# Patient Record
Sex: Female | Born: 1965 | Race: Black or African American | Hispanic: No | Marital: Single | Smoking: Current every day smoker
Health system: Southern US, Community
[De-identification: ages and names within clinical notes are randomized; demographics above are authoritative.]

## PROBLEM LIST (undated history)

## (undated) DIAGNOSIS — Q752 Hypertelorism: Secondary | ICD-10-CM

## (undated) DIAGNOSIS — E119 Type 2 diabetes mellitus without complications: Secondary | ICD-10-CM

## (undated) DIAGNOSIS — I10 Essential (primary) hypertension: Secondary | ICD-10-CM

## (undated) HISTORY — PX: INNER EAR SURGERY: SHX679

## (undated) HISTORY — DX: Type 2 diabetes mellitus without complications: E11.9

## (undated) HISTORY — DX: Hypertelorism: Q75.2

## (undated) HISTORY — PX: KNEE SURGERY: SHX244

## (undated) HISTORY — DX: Essential (primary) hypertension: I10

---

## 2011-10-13 ENCOUNTER — Emergency Department: Payer: Self-pay | Admitting: Emergency Medicine

## 2014-12-13 ENCOUNTER — Other Ambulatory Visit: Payer: Self-pay | Admitting: Physician Assistant

## 2014-12-13 DIAGNOSIS — Z1231 Encounter for screening mammogram for malignant neoplasm of breast: Secondary | ICD-10-CM

## 2014-12-25 ENCOUNTER — Ambulatory Visit: Payer: Self-pay

## 2014-12-27 ENCOUNTER — Ambulatory Visit
Admission: RE | Admit: 2014-12-27 | Discharge: 2014-12-27 | Disposition: A | Discharge status: Home / Self Care | Payer: Self-pay | Source: Ambulatory Visit | Attending: Physician Assistant | Admitting: Physician Assistant

## 2014-12-27 DIAGNOSIS — Z1231 Encounter for screening mammogram for malignant neoplasm of breast: Secondary | ICD-10-CM | POA: Insufficient documentation

## 2016-01-20 ENCOUNTER — Other Ambulatory Visit: Payer: Self-pay | Admitting: Physician Assistant

## 2017-11-04 ENCOUNTER — Ambulatory Visit: Payer: Self-pay

## 2017-11-15 ENCOUNTER — Ambulatory Visit: Payer: Self-pay | Attending: Oncology

## 2017-11-15 ENCOUNTER — Ambulatory Visit
Admission: RE | Admit: 2017-11-15 | Discharge: 2017-11-15 | Disposition: A | Discharge status: Home / Self Care | Payer: Self-pay | Source: Ambulatory Visit | Attending: Oncology | Admitting: Oncology

## 2017-11-15 VITALS — Ht 72.0 in | Wt 366.0 lb

## 2017-11-15 DIAGNOSIS — Z0000 Encounter for general adult medical examination without abnormal findings: Secondary | ICD-10-CM | POA: Insufficient documentation

## 2017-11-15 NOTE — Progress Notes (Signed)
  Subjective:     Patient ID: Sue Bradley, female   DOB: [birth year], 52 y.o.   MRN: [id number]  HPI   Review of Systems     Objective:   Physical Exam  Pulmonary/Chest: Right breast exhibits no inverted nipple, no mass, no nipple discharge, no skin change and no tenderness. Left breast exhibits no inverted nipple, no mass, no nipple discharge, no skin change and no tenderness. Breasts are asymmetrical.  Right breast larger than left;  Large pendulous breasts       Assessment:     52 year old patient presents for [facility] clinic visit.  Patient screened, and meets BCCCP eligibility.  Patient does not have insurance, Medicare or Medicaid.  Handout given on Affordable Care Act.   Instructed patient on breast self awareness using teach back method.  Clinical breast exam unremarkable.  No mass or lump palpated.   Patient is scheduled for pap with Dr. Nelson Chimes in July 2019.    Plan:     Sent for bilateral screening mammogram.

## 2017-11-15 NOTE — Progress Notes (Signed)
Sent for bilateral screening mammogram.  Letter mailed from [facility] to notify of normal mammogram results.  Patient to return in one year for annual screening.  Copy to [facility]. 

## 2017-11-16 ENCOUNTER — Ambulatory Visit: Payer: Self-pay | Admitting: Adult Health Nurse Practitioner

## 2017-11-16 DIAGNOSIS — E119 Type 2 diabetes mellitus without complications: Secondary | ICD-10-CM | POA: Insufficient documentation

## 2017-11-16 DIAGNOSIS — I10 Essential (primary) hypertension: Secondary | ICD-10-CM

## 2017-11-16 DIAGNOSIS — Z0000 Encounter for general adult medical examination without abnormal findings: Secondary | ICD-10-CM

## 2017-11-16 MED ORDER — LISINOPRIL 40 MG PO TABS: 40.0000 mg | ORAL_TABLET | Freq: Every day | ORAL | 3 refills | Status: DC

## 2017-11-16 NOTE — Progress Notes (Signed)
   Subjective:    Patient ID: Sue Bradley, female    DOB: 11-15-1965, 52 y.o.   MRN: [id number]  HPI  .Sue Bradley is a 52 yo female here to establish care with [place]. She has history of diabetes, and hypertension. She presents with dental pain for 6 mo and a cyst on the left side of the neck.  HTN: she takes 40 mg Lisinopril and 12.5 mg HCTZ. Pt needs refill of Lisinopril. Diabetes: she takes 500 mg Metformin. Neck cyst: pt reports pain around the area occasionally. She reports its been there for years.  Pt endorses heart burn. Pt is a smoker. Pt denies drinking and drug use.  Pt is menopausal with infrequent periods.  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 Health maintenance examination 11/16/2017   Allergies as of 11/16/2017   No Known Allergies     Medication List        Accurate as of 11/16/17  6:12 PM. Always use your most recent med list.          hydrochlorothiazide 12.5 MG capsule Commonly known as:  MICROZIDE Take 12.5 mg by mouth daily.   hydrOXYzine 25 MG tablet Commonly known as:  ATARAX/VISTARIL Take 25 mg by mouth 3 (three) times daily as needed.   lisinopril 40 MG tablet Commonly known as:  PRINIVIL,ZESTRIL Take 40 mg by mouth daily.   metFORMIN 500 MG tablet Commonly known as:  GLUCOPHAGE Take by mouth.        Review of Systems  All other systems reviewed and are negative.      Objective:   Physical Exam  Constitutional: She is oriented to person, place, and time. She appears well-developed and well-nourished.  Neck:    Cardiovascular: Normal rate, regular rhythm, normal heart sounds and intact distal pulses.  Pulmonary/Chest: Effort normal and breath sounds normal.  Abdominal: Soft. Bowel sounds are normal.  Neurological: She is alert and oriented to person, place, and time.  Vitals reviewed.   Pulse 92   Temp 98.1 F (36.7 C)   Wt (!) 373 lb (169.2 kg)    SpO2 99%   BMI 50.59 kg/m        Assessment & Plan:   Labs tonight   HTN: Controlled.  Continue current regimen.  Refilled Lisinopril.  Diabetes:  Check A1c tonight Continue current regimen.  Discussed incorporation of exercise.   F/u in 3 mo for maintenance.   Referral to dental

## 2017-11-16 NOTE — Progress Notes (Signed)
Unable to obtain BP. Need larger cuff size

## 2017-11-17 LAB — COMPREHENSIVE METABOLIC PANEL
A/G RATIO: 1.1 — AB (ref 1.2–2.2)
ALT: 13 IU/L (ref 0–32)
AST: 13 IU/L (ref 0–40)
Albumin: 3.7 g/dL (ref 3.5–5.5)
Alkaline Phosphatase: 81 IU/L (ref 39–117)
BUN/Creatinine Ratio: 12 (ref 9–23)
BUN: 10 mg/dL (ref 6–24)
Bilirubin Total: <0.2 mg/dL (ref 0.0–1.2)
CALCIUM: 9.1 mg/dL (ref 8.7–10.2)
CO2: 24 mmol/L (ref 20–29)
CREATININE: 0.83 mg/dL (ref 0.57–1.00)
Chloride: 105 mmol/L (ref 96–106)
GFR calc Af Amer: 96 mL/min/{1.73_m2} (ref >59)
GFR, EST NON AFRICAN AMERICAN: 83 mL/min/{1.73_m2} (ref >59)
Globulin, Total: 3.3 g/dL (ref 1.5–4.5)
Glucose: 136 mg/dL — ABNORMAL HIGH (ref 65–99)
Potassium: 4.3 mmol/L (ref 3.5–5.2)
Sodium: 142 mmol/L (ref 134–144)
Total Protein: 7 g/dL (ref 6.0–8.5)

## 2017-11-17 LAB — CBC
HEMATOCRIT: 39.8 % (ref 34.0–46.6)
Hemoglobin: 12.4 g/dL (ref 11.1–15.9)
MCH: 24.5 pg — ABNORMAL LOW (ref 26.6–33.0)
MCHC: 31.2 g/dL — ABNORMAL LOW (ref 31.5–35.7)
MCV: 79 fL (ref 79–97)
PLATELETS: 462 10*3/uL — AB (ref 150–379)
RBC: 5.06 x10E6/uL (ref 3.77–5.28)
RDW: 18 % — AB (ref 12.3–15.4)
WBC: 10.9 10*3/uL — AB (ref 3.4–10.8)

## 2017-11-17 LAB — LIPID PANEL
CHOL/HDL RATIO: 3.2 ratio (ref 0.0–4.4)
Cholesterol, Total: 191 mg/dL (ref 100–199)
HDL: 59 mg/dL (ref >39)
LDL CALC: 107 mg/dL — AB (ref 0–99)
TRIGLYCERIDES: 127 mg/dL (ref 0–149)
VLDL CHOLESTEROL CAL: 25 mg/dL (ref 5–40)

## 2017-11-17 LAB — HEMOGLOBIN A1C
Est. average glucose Bld gHb Est-mCnc: 148 mg/dL
Hgb A1c MFr Bld: 6.8 % — ABNORMAL HIGH (ref 4.8–5.6)

## 2017-11-17 LAB — TSH: TSH: 1.65 u[IU]/mL (ref 0.450–4.500)

## 2017-11-18 ENCOUNTER — Telehealth: Payer: Self-pay

## 2017-11-18 ENCOUNTER — Telehealth: Payer: Self-pay | Admitting: Family Medicine

## 2017-11-18 NOTE — Telephone Encounter (Signed)
Patient was initiated on Lisinopril on 11/17/2017. She experienced 'shakiness' and 'feeling lethargic.' She states that she is feeling much better and had not taken a 2nd dose of medication.   She denies fever, chills, shortness of breath, cough, and chest pain. She denies dizziness, visual changes, and falls. She denies abdominal pain, nausea, vomiting, and diarrhea. He appetite is good and her bowel movements are normal.   She has family support staying with her. Patient was advised to report to emergency room if symptoms return, or worsen. Patient verbalized understanding.

## 2017-11-18 NOTE — Telephone Encounter (Signed)
Patient called to let us know she thought she was having an adverse effect from lisinopril, prescribed on 11/16/17 appointment.  Called patient to see what was going on and she reports feeling "really loopy" after taking one pill, not being able to sleep, and being shakey. Said she thought about going to the ER, but held off because it was so late.  Patient would to see if there is something else she could take or what she should do since she's experiencing this issues.  Please advise.

## 2017-11-19 ENCOUNTER — Telehealth: Payer: Self-pay

## 2017-11-19 NOTE — Telephone Encounter (Signed)
-----   Message from Jacelyn Pi, NP sent at 11/18/2017  5:16 PM EDT ----- A1c is well controlled at 6.8. Need to continue healthy lifestyle changes as cholesterol is elevated- will discuss medication options at next OV. Other labs stable.

## 2017-11-19 NOTE — Telephone Encounter (Signed)
Called to give lab results. No answer. No voicemail

## 2017-11-22 ENCOUNTER — Ambulatory Visit: Payer: Self-pay | Admitting: Pharmacy Technician

## 2017-11-22 DIAGNOSIS — Z79899 Other long term (current) drug therapy: Secondary | ICD-10-CM

## 2017-11-22 NOTE — Progress Notes (Signed)
  Completed [facility] application and contract.  Patient agreed to all terms of the [facility] contract.    Patient approved to receive medication assistance at [facility] through 2019, as long as eligibility criteria continues to be met.    Provided patient with community resource material based on her particular needs.    Bently Morath J. Brylie Sneath Care Manager [facility]  

## 2017-11-23 ENCOUNTER — Ambulatory Visit: Payer: Self-pay | Admitting: Adult Health Nurse Practitioner

## 2017-11-23 VITALS — BP 162/107 | HR 96 | Temp 98.1°F | Wt 371.2 lb

## 2017-11-23 DIAGNOSIS — F419 Anxiety disorder, unspecified: Secondary | ICD-10-CM | POA: Insufficient documentation

## 2017-11-23 DIAGNOSIS — I10 Essential (primary) hypertension: Secondary | ICD-10-CM

## 2017-11-23 NOTE — Progress Notes (Signed)
   Subjective:    Patient ID: Sue Bradley, female    DOB: April 12, 1966, 52 y.o.   MRN: [id number]  HPI  Sue Bradley is a 52 yo F here for med rx to Lisinopril on 11/17/17 when she started therapy. She  experienced shakiness, fatigue, insomnia and did not take a 2nd dose. Pt reports this is not the 1st time she has had these episodes and the last episode was in March. In March she went to [facility] and was told it was anxiety and menopause and was rx Hydroxyzine PRN. Pt reports she took 1 Hydroxyzine during the episode on 11/17/17 with no relief. She had not taken Hydroxyzine for 2-3 weeks prior to episode.    Patient Active Problem List   Diagnosis Date Noted  . Health maintenance examination 11/16/2017  . Diabetes mellitus without complication (HCC) 11/16/2017  . Hypertension 11/16/2017   Allergies as of 11/23/2017   No Known Allergies     Medication List        Accurate as of 11/23/17  7:17 PM. Always use your most recent med list.          hydrochlorothiazide 12.5 MG capsule Commonly known as:  MICROZIDE Take 12.5 mg by mouth daily.   hydrOXYzine 25 MG tablet Commonly known as:  ATARAX/VISTARIL Take 25 mg by mouth 3 (three) times daily as needed.   metFORMIN 500 MG tablet Commonly known as:  GLUCOPHAGE Take by mouth.        Review of Systems Reviewed A1-6.8, TSH-negative, Lipid-LDL 107, CMET-negative, CBC-some elevation .      Objective:   Physical Exam  Constitutional: She is oriented to person, place, and time. She appears well-developed and well-nourished.  Cardiovascular: Normal rate, regular rhythm and normal heart sounds.  Pulmonary/Chest: Effort normal and breath sounds normal.  Abdominal: Soft. Bowel sounds are normal.  Neurological: She is alert and oriented to person, place, and time.  Vitals reviewed.   BP (!) 162/107 Comment (Patient Position): wrist Comment (Cuff Size): adult  Pulse 96   Temp 98.1 F (36.7 C)   Wt (!) 371 lb 3.2  oz (168.4 kg)   BMI 50.34 kg/m        Assessment & Plan:   Anxiety: Rx 2 tablets  Hydroxyzine when Pt feels attack starting.

## 2017-11-23 NOTE — Patient Instructions (Signed)
Cholesterol Cholesterol is a fat. Your body needs a small amount of cholesterol. Cholesterol (plaque) may build up in your blood vessels (arteries). That makes you more likely to have a heart attack or stroke. You cannot feel your cholesterol level. Having a blood test is the only way to find out if your level is high. Keep your test results. Work with your doctor to keep your cholesterol at a good level. What do the results mean?  Total cholesterol is how much cholesterol is in your blood.  LDL is bad cholesterol. This is the type that can build up. Try to have low LDL.  HDL is good cholesterol. It cleans your blood vessels and carries LDL away. Try to have high HDL.  Triglycerides are fat that the body can store or burn for energy. What are good levels of cholesterol?  Total cholesterol below 200.  LDL below 100 is good for people who have health risks. LDL below 70 is good for people who have very high risks.  HDL above 40 is good. It is best to have HDL of 60 or higher.  Triglycerides below 150. How can I lower my cholesterol? Diet Follow your diet program as told by your doctor.  Choose fish, white meat chicken, or turkey that is roasted or baked. Try not to eat red meat, fried foods, sausage, or lunch meats.  Eat lots of fresh fruits and vegetables.  Choose whole grains, beans, pasta, potatoes, and cereals.  Choose olive oil, corn oil, or canola oil. Only use small amounts.  Try not to eat butter, mayonnaise, shortening, or palm kernel oils.  Try not to eat foods with trans fats.  Choose low-fat or nonfat dairy foods. ? Drink skim or nonfat milk. ? Eat low-fat or nonfat yogurt and cheeses. ? Try not to drink whole milk or cream. ? Try not to eat ice cream, egg yolks, or full-fat cheeses.  Healthy desserts include angel food cake, ginger snaps, animal crackers, hard candy, popsicles, and low-fat or nonfat frozen yogurt. Try not to eat pastries, cakes, pies, and  cookies.  Exercise Follow your exercise program as told by your doctor.  Be more active. Try gardening, walking, and taking the stairs.  Ask your doctor about ways that you can be more active.  Medicine  Take over-the-counter and prescription medicines only as told by your doctor. This information is not intended to replace advice given to you by your health care provider. Make sure you discuss any questions you have with your health care provider. Document Released: 10/02/2008 Document Revised: 02/05/2016 Document Reviewed: 01/16/2016 Elsevier Interactive Patient Education  2018 Elsevier Inc.  

## 2017-11-26 ENCOUNTER — Other Ambulatory Visit: Payer: Self-pay

## 2017-11-26 ENCOUNTER — Emergency Department
Admission: EM | Admit: 2017-11-26 | Discharge: 2017-11-26 | Disposition: A | Discharge status: Home / Self Care | Payer: Self-pay | Attending: Emergency Medicine | Admitting: Emergency Medicine

## 2017-11-26 DIAGNOSIS — Z7984 Long term (current) use of oral hypoglycemic drugs: Secondary | ICD-10-CM | POA: Insufficient documentation

## 2017-11-26 DIAGNOSIS — I10 Essential (primary) hypertension: Secondary | ICD-10-CM | POA: Insufficient documentation

## 2017-11-26 DIAGNOSIS — Z79899 Other long term (current) drug therapy: Secondary | ICD-10-CM | POA: Insufficient documentation

## 2017-11-26 DIAGNOSIS — F17210 Nicotine dependence, cigarettes, uncomplicated: Secondary | ICD-10-CM | POA: Insufficient documentation

## 2017-11-26 DIAGNOSIS — E119 Type 2 diabetes mellitus without complications: Secondary | ICD-10-CM | POA: Insufficient documentation

## 2017-11-26 LAB — CBC WITH DIFFERENTIAL/PLATELET
BASOS PCT: 1 %
Basophils Absolute: 0.1 10*3/uL (ref 0–0.1)
EOS ABS: 0.3 10*3/uL (ref 0–0.7)
EOS PCT: 2 %
HCT: 40 % (ref 35.0–47.0)
Hemoglobin: 13.5 g/dL (ref 12.0–16.0)
LYMPHS ABS: 3.3 10*3/uL (ref 1.0–3.6)
Lymphocytes Relative: 25 %
MCH: 26.2 pg (ref 26.0–34.0)
MCHC: 33.8 g/dL (ref 32.0–36.0)
MCV: 77.5 fL — ABNORMAL LOW (ref 80.0–100.0)
MONO ABS: 0.7 10*3/uL (ref 0.2–0.9)
MONOS PCT: 5 %
Neutro Abs: 9 10*3/uL — ABNORMAL HIGH (ref 1.4–6.5)
Neutrophils Relative %: 67 %
Platelets: 373 10*3/uL (ref 150–440)
RBC: 5.16 MIL/uL (ref 3.80–5.20)
RDW: 17.8 % — AB (ref 11.5–14.5)
WBC: 13.4 10*3/uL — ABNORMAL HIGH (ref 3.6–11.0)

## 2017-11-26 LAB — COMPREHENSIVE METABOLIC PANEL
ALBUMIN: 3.4 g/dL — AB (ref 3.5–5.0)
ALT: 19 U/L (ref 14–54)
ANION GAP: 9 (ref 5–15)
AST: 20 U/L (ref 15–41)
Alkaline Phosphatase: 79 U/L (ref 38–126)
BUN: 6 mg/dL (ref 6–20)
CO2: 22 mmol/L (ref 22–32)
Calcium: 8.7 mg/dL — ABNORMAL LOW (ref 8.9–10.3)
Chloride: 104 mmol/L (ref 101–111)
Creatinine, Ser: 0.73 mg/dL (ref 0.44–1.00)
GFR calc Af Amer: >60 mL/min (ref >60)
GFR calc non Af Amer: >60 mL/min (ref >60)
Glucose, Bld: 113 mg/dL — ABNORMAL HIGH (ref 65–99)
POTASSIUM: 3.8 mmol/L (ref 3.5–5.1)
SODIUM: 135 mmol/L (ref 135–145)
Total Bilirubin: 0.5 mg/dL (ref 0.3–1.2)
Total Protein: 7.9 g/dL (ref 6.5–8.1)

## 2017-11-26 NOTE — Discharge Instructions (Addendum)
Please seek medical attention for any high fevers, chest pain, shortness of breath, change in behavior, persistent vomiting, bloody stool or any other new or concerning symptoms.  

## 2017-11-26 NOTE — ED Provider Notes (Signed)
[facility] Emergency Department Provider Note   ____________________________________________   I have reviewed the triage vital signs and the nursing notes.   HISTORY  Chief Complaint Hypertension   History limited by: Not Limited   HPI Sue Bradley is a 52 y.o. female who presents to the emergency department today because of dentist concerns for hypertension.  Patient states that she was at the dentist to get some teeth pulled however they cannot do the procedure because of high blood pressure.  Patient states he just started back on blood pressure medication a couple days ago after having been off of them for roughly 1 week.  Patient states she has a slight headache which has improved by the time of my examination.  Otherwise she denies any chest pain or shortness of breath.   Per medical record review patient has a history of DM, HTN  Past Medical History:  Diagnosis Date  . Diabetes (HCC)   . Hypertelorism   . Hypertension     Patient Active Problem List   Diagnosis Date Noted  . Anxiety 11/23/2017  . Health maintenance examination 11/16/2017  . Diabetes mellitus without complication (HCC) 11/16/2017  . Hypertension 11/16/2017    Past Surgical History:  Procedure Laterality Date  . INNER EAR SURGERY Bilateral   . KNEE SURGERY Left     Prior to Admission medications   Medication Sig Start Date End Date Taking? Authorizing Provider  hydrochlorothiazide (MICROZIDE) 12.5 MG capsule Take 12.5 mg by mouth daily.    [provider]  hydrOXYzine (ATARAX/VISTARIL) 25 MG tablet Take 25 mg by mouth 3 (three) times daily as needed.    [provider]  lisinopril (PRINIVIL,ZESTRIL) 40 MG tablet Take 1 tablet (40 mg total) by mouth daily. 11/23/17   Doles-Johnson, Teah, NP  metFORMIN (GLUCOPHAGE) 500 MG tablet Take by mouth.    [provider]    Allergies Patient has no known allergies.  Family History  Problem  Relation Age of Onset  . Diabetes Mother   . Hypertension Mother   . Diabetes Father   . Hypertension Father   . Hypertension Sister   . Diabetes Brother     Social History Social History   Tobacco Use  . Smoking status: Current Every Day Smoker    Packs/day: 0.50    Years: 29.00    Pack years: 14.50    Types: Cigarettes  . Smokeless tobacco: Never Used  Substance Use Topics  . Alcohol use: Not Currently  . Drug use: Not Currently    Types: Marijuana    Review of Systems Constitutional: No fever/chills Eyes: No visual changes. ENT: Positive for poor dentition Cardiovascular: Denies chest pain. Respiratory: Denies shortness of breath. Gastrointestinal: No abdominal pain.  No nausea, no vomiting.  No diarrhea.   Genitourinary: Negative for dysuria. Musculoskeletal: Negative for back pain. Skin: Negative for rash. Neurological: Negative for headaches, focal weakness or numbness.  ____________________________________________   PHYSICAL EXAM:  VITAL SIGNS: ED Triage Vitals  Enc Vitals Group     BP 11/26/17 1813 (!) 180/125     Pulse Rate 11/26/17 1813 (!) 107     Resp 11/26/17 1813 17     Temp 11/26/17 1813 98 F (36.7 C)     Temp Source 11/26/17 1813 Oral     SpO2 11/26/17 1813 99 %     Weight 11/26/17 1813 (!) 371 lb (168.3 kg)     Height 11/26/17 1813 6' (1.829 m)  Head Circumference --      Peak Flow --      Pain Score 11/26/17 1812 7   Constitutional: Alert and oriented. Well appearing and in no distress. Eyes: Conjunctivae are normal.  ENT   Head: Normocephalic and atraumatic.   Nose: No congestion/rhinnorhea.   Mouth/Throat: Mucous membranes are moist.   Neck: No stridor. Hematological/Lymphatic/Immunilogical: No cervical lymphadenopathy. Cardiovascular: Normal rate, regular rhythm.  No murmurs, rubs, or gallops.  Respiratory: Normal respiratory effort without tachypnea nor retractions. Breath sounds are clear and equal bilaterally.  No wheezes/rales/rhonchi. Gastrointestinal: Soft and non tender. No rebound. No guarding.  Genitourinary: Deferred Musculoskeletal: Normal range of motion in all extremities. No lower extremity edema. Neurologic:  Normal speech and language. No gross focal neurologic deficits are appreciated.  Skin:  Skin is warm, dry and intact. No rash noted. Psychiatric: Mood and affect are normal. Speech and behavior are normal. Patient exhibits appropriate insight and judgment.  ____________________________________________    LABS (pertinent positives/negatives)  CMP glu 113, ca 8.7, cr 0.73, na 135, k 3.8 CBC wbc 13.4, hgb 13.5, plt 373  ____________________________________________   EKG  None  ____________________________________________    RADIOLOGY  None  ____________________________________________   PROCEDURES  Procedures  ____________________________________________   INITIAL IMPRESSION / ASSESSMENT AND PLAN / ED COURSE  Pertinent labs & imaging results that were available during my care of the patient were reviewed by me and considered in my medical decision making (see chart for details).  Patient with history of hypertension who presents to the emergency department today because of hypertension.  Patient has a mild headache which has improved.  I doubt spontaneous intracranial bleed.  She denies any chest pain.  Blood work without concerning findings.  This point discussed patient importance of continuing her hypertensive medication following up with primary care provider.   ____________________________________________   FINAL CLINICAL IMPRESSION(S) / ED DIAGNOSES  Final diagnoses:  Hypertension, unspecified type     Note: This dictation was prepared with Dragon dictation. Any transcriptional errors that result from this process are unintentional     Phineas Semen, MD 11/26/17 2102

## 2017-11-26 NOTE — ED Triage Notes (Signed)
Pt went to dentist today and they told her that her BP was high and she needed to be seen at the ED - pt went home and started having a headache so she decided to come to ED for eval

## 2017-11-26 NOTE — ED Notes (Signed)
Pt to the er for HTN. Pt was at the dentist office today and BP was over 200

## 2017-12-02 ENCOUNTER — Ambulatory Visit: Payer: Self-pay | Admitting: Ophthalmology

## 2017-12-02 LAB — HM DIABETES EYE EXAM

## 2017-12-07 ENCOUNTER — Emergency Department
Admission: EM | Admit: 2017-12-07 | Discharge: 2017-12-07 | Disposition: A | Discharge status: Home / Self Care | Payer: Self-pay | Attending: Emergency Medicine | Admitting: Emergency Medicine

## 2017-12-07 ENCOUNTER — Encounter: Payer: Self-pay | Admitting: Emergency Medicine

## 2017-12-07 DIAGNOSIS — F17210 Nicotine dependence, cigarettes, uncomplicated: Secondary | ICD-10-CM | POA: Insufficient documentation

## 2017-12-07 DIAGNOSIS — E119 Type 2 diabetes mellitus without complications: Secondary | ICD-10-CM | POA: Insufficient documentation

## 2017-12-07 DIAGNOSIS — I10 Essential (primary) hypertension: Secondary | ICD-10-CM | POA: Insufficient documentation

## 2017-12-07 DIAGNOSIS — R531 Weakness: Secondary | ICD-10-CM | POA: Insufficient documentation

## 2017-12-07 LAB — URINALYSIS, COMPLETE (UACMP) WITH MICROSCOPIC
BILIRUBIN URINE: NEGATIVE
GLUCOSE, UA: NEGATIVE mg/dL
HGB URINE DIPSTICK: NEGATIVE
Ketones, ur: 5 mg/dL — AB
Leukocytes, UA: NEGATIVE
NITRITE: NEGATIVE
PH: 6 (ref 5.0–8.0)
Protein, ur: NEGATIVE mg/dL
SPECIFIC GRAVITY, URINE: 1.008 (ref 1.005–1.030)

## 2017-12-07 LAB — CBC
HCT: 46.1 % (ref 35.0–47.0)
HEMOGLOBIN: 15 g/dL (ref 12.0–16.0)
MCH: 25.6 pg — ABNORMAL LOW (ref 26.0–34.0)
MCHC: 32.5 g/dL (ref 32.0–36.0)
MCV: 78.6 fL — ABNORMAL LOW (ref 80.0–100.0)
Platelets: 442 10*3/uL — ABNORMAL HIGH (ref 150–440)
RBC: 5.86 MIL/uL — ABNORMAL HIGH (ref 3.80–5.20)
RDW: 18.1 % — ABNORMAL HIGH (ref 11.5–14.5)
WBC: 12 10*3/uL — ABNORMAL HIGH (ref 3.6–11.0)

## 2017-12-07 LAB — BASIC METABOLIC PANEL
ANION GAP: 12 (ref 5–15)
BUN: 10 mg/dL (ref 6–20)
CALCIUM: 9.3 mg/dL (ref 8.9–10.3)
CO2: 23 mmol/L (ref 22–32)
CREATININE: 0.84 mg/dL (ref 0.44–1.00)
Chloride: 98 mmol/L — ABNORMAL LOW (ref 101–111)
GLUCOSE: 207 mg/dL — AB (ref 65–99)
Potassium: 3.6 mmol/L (ref 3.5–5.1)
Sodium: 133 mmol/L — ABNORMAL LOW (ref 135–145)

## 2017-12-07 LAB — POCT PREGNANCY, URINE: PREG TEST UR: NEGATIVE

## 2017-12-07 LAB — TROPONIN I: Troponin I: <0.03 ng/mL (ref <0.03)

## 2017-12-07 NOTE — Discharge Instructions (Addendum)
These call the number provided for [facility] neurology to arrange a follow-up appointment.  Return to the emergency department for any personally concerning symptoms.

## 2017-12-07 NOTE — ED Notes (Signed)

## 2017-12-07 NOTE — ED Provider Notes (Signed)
[facility] Emergency Department Provider Note  Time seen: 6:18 PM  I have reviewed the triage vital signs and the nursing notes.   HISTORY  Chief Complaint Near Syncope    HPI Sue Bradley is a 52 y.o. female with a past medical history of diabetes, hypertension presents to the emergency department for medical evaluation.  According to the patient yesterday evening she had an episode in which she stares off into space, becomes less responsive per sister and was incontinent of stool.  She states after the episode she states her reactions and speech are very slow for the next several hours and she feels very weak and then she starts feeling better once again.  She states this is the fifth time this is happened over the past 1 year.  She says she does not have insurance so she has not been evaluated by any specialist.  Patient states she came for evaluation today because she is tired of this happening and wants to get some answers.  She states she has been told in the past it is because of anxiety but she states she thinks something else is going on.  Patient is largely negative review of systems denies any chest pain or abdominal pain, nausea, vomiting, dysuria, fever.  Overall patient states she feels well currently.   Past Medical History:  Diagnosis Date  . Diabetes (HCC)   . Hypertelorism   . Hypertension     Patient Active Problem List   Diagnosis Date Noted  . Anxiety 11/23/2017  . Health maintenance examination 11/16/2017  . Diabetes mellitus without complication (HCC) 11/16/2017  . Hypertension 11/16/2017    Past Surgical History:  Procedure Laterality Date  . INNER EAR SURGERY Bilateral   . KNEE SURGERY Left     Prior to Admission medications   Medication Sig Start Date End Date Taking? Authorizing Provider  hydrochlorothiazide (MICROZIDE) 12.5 MG capsule Take 12.5 mg by mouth daily.    [provider]  hydrOXYzine  (ATARAX/VISTARIL) 25 MG tablet Take 25 mg by mouth 3 (three) times daily as needed.    [provider]  lisinopril (PRINIVIL,ZESTRIL) 40 MG tablet Take 1 tablet (40 mg total) by mouth daily. 11/23/17   Doles-Johnson, Teah, NP  metFORMIN (GLUCOPHAGE) 500 MG tablet Take by mouth.    [provider]    No Known Allergies  Family History  Problem Relation Age of Onset  . Diabetes Mother   . Hypertension Mother   . Diabetes Father   . Hypertension Father   . Hypertension Sister   . Diabetes Brother     Social History Social History   Tobacco Use  . Smoking status: Current Every Day Smoker    Packs/day: 0.50    Years: 29.00    Pack years: 14.50    Types: Cigarettes  . Smokeless tobacco: Never Used  Substance Use Topics  . Alcohol use: Not Currently  . Drug use: Not Currently    Types: Marijuana    Review of Systems Constitutional: Negative for fever.  Denies weakness after the episode, now resolved. Eyes: Negative for visual complaints ENT: Negative for recent illness/congestion Cardiovascular: Negative for chest pain. Respiratory: Negative for shortness of breath. Gastrointestinal: Negative for abdominal pain, vomiting.  Episode of diarrhea last night with the staring off episode. Genitourinary: Negative for urinary compaints Musculoskeletal: Negative for musculoskeletal complaints Skin: Negative for skin complaints  Neurological: Negative for headache All other ROS negative  ____________________________________________   PHYSICAL EXAM:  VITAL SIGNS: ED Triage Vitals  Enc Vitals Group     BP 12/07/17 1614 (!) 153/102     Pulse Rate 12/07/17 1614 (!) 108     Resp 12/07/17 1614 16     Temp 12/07/17 1614 98.4 F (36.9 C)     Temp Source 12/07/17 1614 Oral     SpO2 12/07/17 1614 100 %     Weight 12/07/17 1615 (!) 371 lb (168.3 kg)     Height 12/07/17 1615 6' (1.829 m)     Head Circumference --      Peak Flow --      Pain Score 12/07/17 1614 4      Pain Loc --      Pain Edu? --      Excl. in GC? --     Constitutional: Alert and oriented. Well appearing and in no distress. Eyes: Normal exam ENT   Head: Normocephalic and atraumatic.   Mouth/Throat: Mucous membranes are moist. Cardiovascular: Normal rate, regular rhythm. No murmur Respiratory: Normal respiratory effort without tachypnea nor retractions. Breath sounds are clear Gastrointestinal: Soft and nontender. No distention.  Obese. Musculoskeletal: Nontender with normal range of motion in all extremities Neurologic:  Normal speech and language. No gross focal neurologic deficits  Skin:  Skin is warm, dry and intact.  Psychiatric: Mood and affect are normal.  ____________________________________________    EKG  EKG reviewed and interpreted by myself shows sinus tachycardia 114 bpm with a narrow QRS, normal axis, largely normal intervals and nonspecific ST changes.  No ST elevation.  ____________________________________________   INITIAL IMPRESSION / ASSESSMENT AND PLAN / ED COURSE  Pertinent labs & imaging results that were available during my care of the patient were reviewed by me and considered in my medical decision making (see chart for details).  Patient presents to the emergency department for episodes of staring off into space, generalized weakness oftentimes incontinent of stool.  This is the fifth episode over the past 1 year.  Episode occurred last night.  Differential would include partial seizure, anxiety, near syncope.  Patient's initial labs are largely within normal limits/baseline for the patient.  I have added on cardiac enzymes.  I discussed with the patient if her work-up today is normal I would highly recommend she follow-up with neurology for consideration of an EEG.  Patient is agreeable to this plan of care.  States she has charity care at [facility] and would like to follow-up with [facility] neurology.  Troponin is negative.  Patient continues to appear  well in the emergency department.  We will discharge with neurology follow-up.  Patient agreeable to plan of care.  ____________________________________________   FINAL CLINICAL IMPRESSION(S) / ED DIAGNOSES  Altered mental status, resolved    Minna Antis, MD 12/07/17 [facility] [place]

## 2017-12-07 NOTE — ED Triage Notes (Signed)
Patient presents to the ED for recurrent episodes of weakness and altered mental status.  Patient states the last episode occurred around 8pm yesterday evening.  Patient reports feeling very hot and sweating.  Patient reports she was incontinent of stool.  Patient states her sister said that she had a blank stare.  Patient denies fully passing out.  Patient states she checked her blood sugar after the episode and it was 101.  Patient states this is the 5th episode this year.  Patient has seen her doctor and has been told this is occurring due to anxiety.

## 2017-12-09 ENCOUNTER — Telehealth: Payer: Self-pay | Admitting: Licensed Clinical Social Worker

## 2017-12-09 NOTE — Telephone Encounter (Signed)
Patient left voicemail about issues with her anxiety. Clinician attempted to call her back to discuss services and treatment for anxiety but there was not an option to leave voicemail.

## 2017-12-29 ENCOUNTER — Other Ambulatory Visit: Payer: Self-pay | Admitting: Ophthalmology

## 2018-02-15 ENCOUNTER — Ambulatory Visit: Payer: Self-pay

## 2018-06-02 ENCOUNTER — Ambulatory Visit: Payer: Self-pay | Admitting: Ophthalmology

## 2019-03-22 ENCOUNTER — Ambulatory Visit
Admission: RE | Admit: 2019-03-22 | Discharge: 2019-03-22 | Disposition: A | Discharge status: Home / Self Care | Payer: Self-pay | Source: Ambulatory Visit | Attending: Oncology | Admitting: Oncology

## 2019-03-22 ENCOUNTER — Other Ambulatory Visit: Payer: Self-pay

## 2019-03-22 ENCOUNTER — Ambulatory Visit: Payer: Self-pay | Attending: Oncology

## 2019-03-22 VITALS — BP 188/111 | HR 98 | Temp 97.3°F | Ht 69.0 in | Wt 391.0 lb

## 2019-03-22 DIAGNOSIS — Z0000 Encounter for general adult medical examination without abnormal findings: Secondary | ICD-10-CM | POA: Insufficient documentation

## 2019-03-22 NOTE — Progress Notes (Signed)
  Subjective:     Patient ID: Sue Bradley, female   DOB: [birth year], 53 y.o.   MRN: [id number]  HPI   Review of Systems     Objective:   Physical Exam Chest:     Breasts:        Right: No swelling, bleeding, inverted nipple, mass, nipple discharge, skin change or tenderness.        Left: No swelling, bleeding, inverted nipple, mass, nipple discharge, skin change or tenderness.        Assessment:     53 year old patient returns for [facility] clinic visit.  Patient screened, and meets BCCCP eligibility.  Patient does not have insurance, Medicare or Medicaid. Instructed patient on breast self awareness using teach back method. Clinical breast exam unremarkable. No mass or lump palpated.  Patient reports she has upcoming appointment with primary physician to assess hypertension.    Plan:     Sent for bilateral screening mammogram.

## 2019-03-23 NOTE — Progress Notes (Signed)
Letter mailed from [facility] to notify of normal mammogram results.  Patient to return in one year for annual screening.  Copy to [facility]. 

## 2021-04-23 ENCOUNTER — Encounter: Payer: Self-pay | Admitting: Primary Care

## 2021-05-12 NOTE — Progress Notes (Signed)
  Subjective:     Patient ID: Sue Bradley, female   DOB: 1965/12/16, 55 y.o.   MRN: [id number]  HPI   Review of Systems     Objective:   Physical Exam Chest:  Breasts:    Right: No swelling, bleeding, inverted nipple, mass, nipple discharge or skin change.     Left: No swelling, bleeding, inverted nipple, mass, nipple discharge or skin change.  Genitourinary:    Labia:        Right: No rash, tenderness, lesion or injury.        Left: No rash, tenderness, lesion or injury.      Vagina: No signs of injury and foreign body. No vaginal discharge, erythema, tenderness, bleeding, lesions or prolapsed vaginal walls.     Cervix: No cervical motion tenderness, discharge, friability, lesion, erythema, cervical bleeding or eversion.     Uterus: Not deviated, not enlarged, not fixed, not tender and no uterine prolapse.      Adnexa:        Right: No mass, tenderness or fullness.         Left: No mass, tenderness or fullness.         Assessment:     Patient pre-screened for BCCCP eligibility due to COVID 19 precautions. Two patient identifiers used for verification that I was speaking to correct patient.   Patient is due for pap.   Instructed patient on breast self awareness using teach back method.  Clinical breast exam unremarkable. No mass or lump palpated.  Pelvic exam normal.   Risk Assessment     Risk Scores       05/14/2021 03/22/2019   Last edited by: Scarlett Presto, RN Jim Like, RN   5-year risk: 1.3 % 1.2 %   Lifetime risk: 8.2 % 8.5 %            Plan:     Patient sent to [facility] 05/14/21 for B[facility]CP screening mammogram.  Specimen collected for pap.

## 2021-05-14 ENCOUNTER — Ambulatory Visit
Admission: RE | Admit: 2021-05-14 | Discharge: 2021-05-14 | Disposition: A | Discharge status: Home / Self Care | Payer: Self-pay | Source: Ambulatory Visit | Attending: Oncology | Admitting: Oncology

## 2021-05-14 ENCOUNTER — Other Ambulatory Visit: Payer: Self-pay

## 2021-05-14 ENCOUNTER — Ambulatory Visit: Payer: Self-pay | Attending: Oncology

## 2021-05-14 VITALS — BP 157/100 | HR 89 | Temp 97.3°F | Resp 18 | Ht 70.0 in | Wt 354.0 lb

## 2021-05-14 DIAGNOSIS — Z0000 Encounter for general adult medical examination without abnormal findings: Secondary | ICD-10-CM

## 2021-05-19 LAB — IGP, APTIMA HPV: HPV Aptima: NEGATIVE

## 2021-05-22 NOTE — Progress Notes (Signed)
Phone patient with normal mammogram, and pap results.  Next pap in 5 years.  Patient  Instructed to return for annual screening Copy to [facility].

## 2022-05-20 ENCOUNTER — Other Ambulatory Visit: Payer: Self-pay

## 2022-05-20 DIAGNOSIS — Z1231 Encounter for screening mammogram for malignant neoplasm of breast: Secondary | ICD-10-CM

## 2022-05-25 ENCOUNTER — Ambulatory Visit: Payer: Self-pay | Attending: Hematology and Oncology | Admitting: Hematology and Oncology

## 2022-05-25 ENCOUNTER — Ambulatory Visit
Admission: RE | Admit: 2022-05-25 | Discharge: 2022-05-25 | Disposition: A | Discharge status: Home / Self Care | Payer: Self-pay | Source: Ambulatory Visit | Attending: Obstetrics and Gynecology | Admitting: Obstetrics and Gynecology

## 2022-05-25 VITALS — BP 164/96 | Wt 345.8 lb

## 2022-05-25 DIAGNOSIS — Z1231 Encounter for screening mammogram for malignant neoplasm of breast: Secondary | ICD-10-CM | POA: Insufficient documentation

## 2022-05-25 MED ORDER — NYSTATIN 100000 UNIT/GM EX POWD: 1.0000 | Freq: Three times a day (TID) | CUTANEOUS | 0 refills | Status: AC

## 2022-05-25 NOTE — Patient Instructions (Signed)
[place] about self breast awareness. Patient did not need a Pap smear today due to last Pap smear was in 2022 per patient. Let her know BCCCP will cover Pap smears every 5 years unless has a history of abnormal Pap smears. [facility] Appointment scheduled for 05/25/22. Patient aware of appointment and will be there. Let patient know will follow up with her within the next couple weeks with results. Esau Grew verbalized understanding. She will return to clinic in one year for repeat mammogram and pap due in 2027. Melodye Ped, NP 1:51 PM

## 2022-05-25 NOTE — Progress Notes (Signed)
[name] is a 56 y.o. female who presents to [facility] clinic today with no complaints.    Pap Smear: Pap not smear completed today. Last Pap smear was 2022 at [facility] and was normal. Per patient has no history of an abnormal Pap smear. Last Pap smear result is not available in Epic.   Physical exam: Breasts Breasts symmetrical. No skin abnormalities bilateral breasts. No nipple retraction bilateral breasts. No nipple discharge bilateral breasts. No lymphadenopathy. No lumps palpated bilateral breasts.     MS DIGITAL SCREENING TOMO BILATERAL  Result Date: 05/16/2021 CLINICAL DATA:  Screening. EXAM: DIGITAL SCREENING BILATERAL MAMMOGRAM WITH TOMOSYNTHESIS AND CAD TECHNIQUE: Bilateral screening digital craniocaudal and mediolateral oblique mammograms were obtained. Bilateral screening digital breast tomosynthesis was performed. The images were evaluated with computer-aided detection. COMPARISON:  Previous exam(s). ACR Breast Density Category a: The breast tissue is almost entirely fatty. FINDINGS: There are no findings suspicious for malignancy. IMPRESSION: No mammographic evidence of malignancy. A result letter of this screening mammogram will be mailed directly to the patient. RECOMMENDATION: Screening mammogram in one year. (Code:SM-B-01Y) BI-RADS CATEGORY  1: Negative. Electronically Signed   By: Ammie Ferrier M.D.   On: 05/16/2021 12:35  MS DIGITAL SCREENING TOMO BILATERAL  Result Date: 03/23/2019 CLINICAL DATA:  Screening. EXAM: DIGITAL SCREENING BILATERAL MAMMOGRAM WITH TOMO AND CAD COMPARISON:  Previous exam(s). ACR Breast Density Category a: The breast tissue is almost entirely fatty. FINDINGS: There are no findings suspicious for malignancy. Images were processed with CAD. IMPRESSION: No mammographic evidence of malignancy. A result letter of this screening mammogram will be mailed directly to the patient. RECOMMENDATION: Screening mammogram in one year. (Code:SM-B-01Y)  BI-RADS CATEGORY  1: Negative. Electronically Signed   By: Audie Pinto M.D.   On: 03/23/2019 09:28   MS DIGITAL SCREENING TOMO BILATERAL  Result Date: 11/15/2017 CLINICAL DATA:  Screening. EXAM: DIGITAL SCREENING BILATERAL MAMMOGRAM WITH TOMO AND CAD COMPARISON:  Previous exam(s). ACR Breast Density Category b: There are scattered areas of fibroglandular density. FINDINGS: There are no findings suspicious for malignancy. Images were processed with CAD. IMPRESSION: No mammographic evidence of malignancy. A result letter of this screening mammogram will be mailed directly to the patient. RECOMMENDATION: Screening mammogram in one year. (Code:SM-B-01Y) BI-RADS CATEGORY  1: Negative. Electronically Signed   By: Fidela Salisbury M.D.   On: 11/15/2017 16:48      Pelvic/Bimanual Pap is not indicated today    Smoking History: Patient has is a current smoker at 1/2 packs per day and was referred to quit line.    Patient Navigation: Patient education provided. Access to services provided for patient through [facility] program. No interpreter provided. No transportation provided   Colorectal Cancer Screening: Per patient has never had colonoscopy completed No complaints today. Will have FIT test done at [facility].   Breast and Cervical Cancer Risk Assessment: Patient does not have family history of breast cancer, known genetic mutations, or radiation treatment to the chest before age 55. Patient does not have history of cervical dysplasia, immunocompromised, or DES exposure in-utero.  Risk Assessment   No risk assessment data for the current encounter  Risk Scores       05/14/2021   Last edited by: Theodore Demark, RN   5-year risk: 1.3 %   Lifetime risk: 8.2 %            A: BCCCP exam without pap smear No complaints with benign exam. Nystatin sent for yeast under left breast.  P: Referred patient to the [facility] for a screening mammogram. Appointment  scheduled 05/25/22.  Dayton Scrape A, NP 05/25/2022 1:30 PM

## 2022-12-02 ENCOUNTER — Other Ambulatory Visit: Payer: Self-pay

## 2023-03-09 ENCOUNTER — Other Ambulatory Visit: Payer: Self-pay | Admitting: Physician Assistant

## 2023-03-09 DIAGNOSIS — R569 Unspecified convulsions: Secondary | ICD-10-CM

## 2023-03-25 ENCOUNTER — Encounter: Payer: Self-pay | Admitting: Physician Assistant

## 2023-03-30 ENCOUNTER — Ambulatory Visit
Admission: RE | Admit: 2023-03-30 | Discharge: 2023-03-30 | Disposition: A | Discharge status: Home / Self Care | Payer: Medicaid Other | Source: Ambulatory Visit | Attending: Physician Assistant | Admitting: Physician Assistant

## 2023-03-30 DIAGNOSIS — R569 Unspecified convulsions: Secondary | ICD-10-CM

## 2023-03-30 MED ORDER — GADOPICLENOL 0.5 MMOL/ML IV SOLN
10.0000 mL | Freq: Once | INTRAVENOUS | Status: AC | PRN
  Administered 2023-03-30: 10 mL via INTRAVENOUS

## 2023-07-09 IMAGING — MG MM DIGITAL SCREENING BILAT W/ TOMO AND CAD
8 of 15 series · 8 of 40 positions shown · non-contrast
Comparison: Previous exam(s).

ACR Breast Density Category a: The breast tissue is almost entirely
fatty.

CLINICAL DATA: Screening.

EXAM:
DIGITAL SCREENING BILATERAL MAMMOGRAM WITH TOMOSYNTHESIS AND CAD
TECHNIQUE: Bilateral screening digital craniocaudal and mediolateral oblique
mammograms were obtained. Bilateral screening digital breast
tomosynthesis was performed. The images were evaluated with
computer-aided detection.

[L XCCL synth-2D]
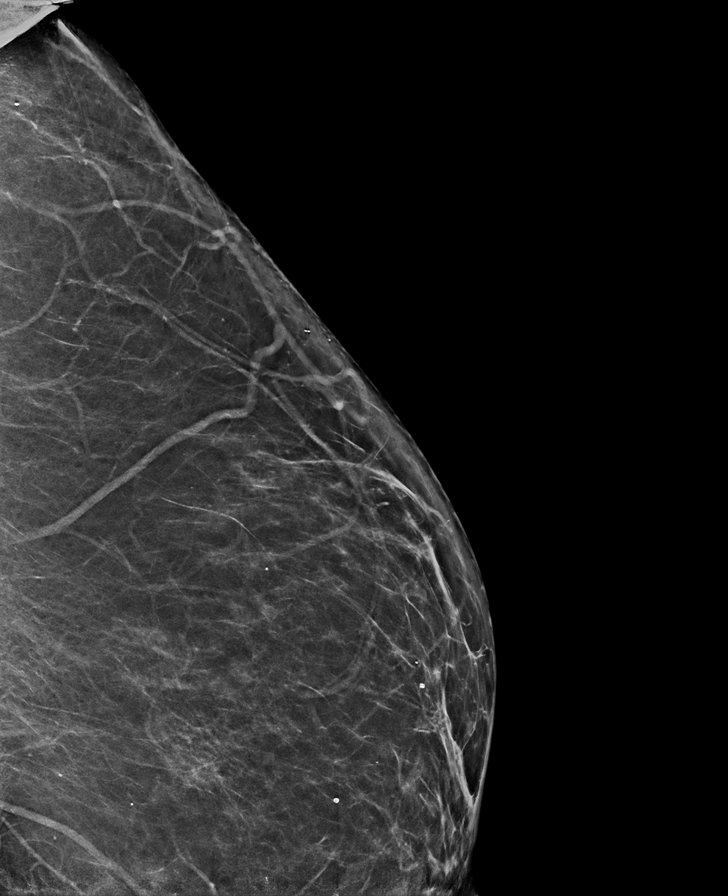

[L MLO synth-2D (1 of 2)]
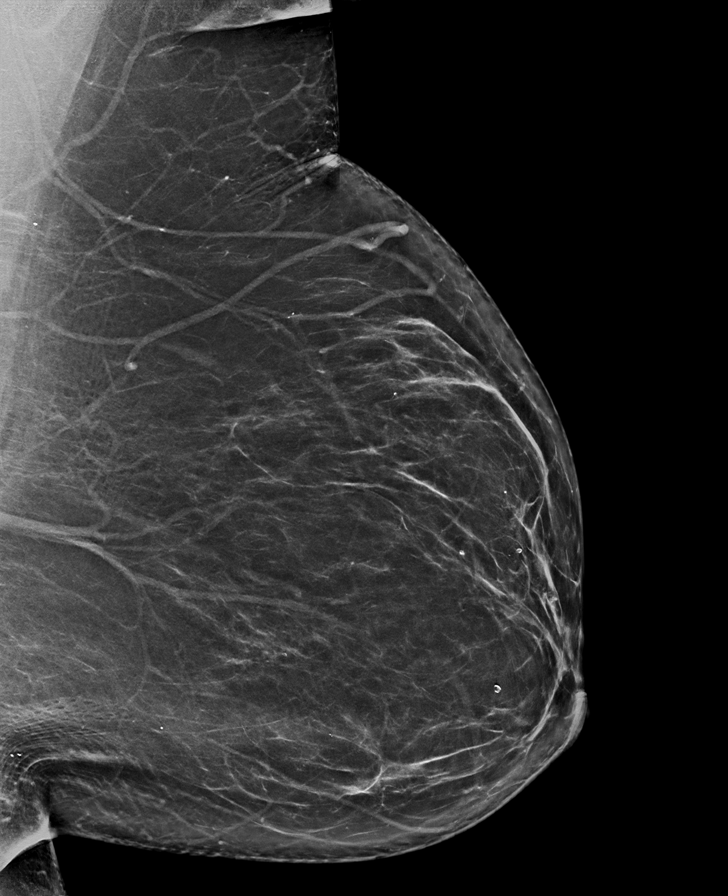

[R MLO synth-2D (1 of 2)]
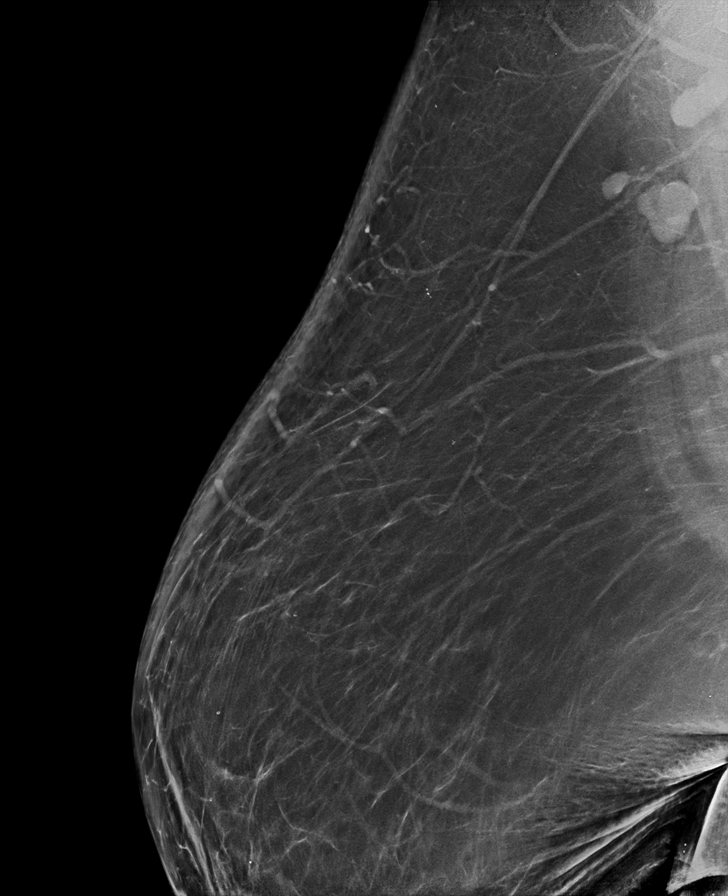

[R XCCL synth-2D]
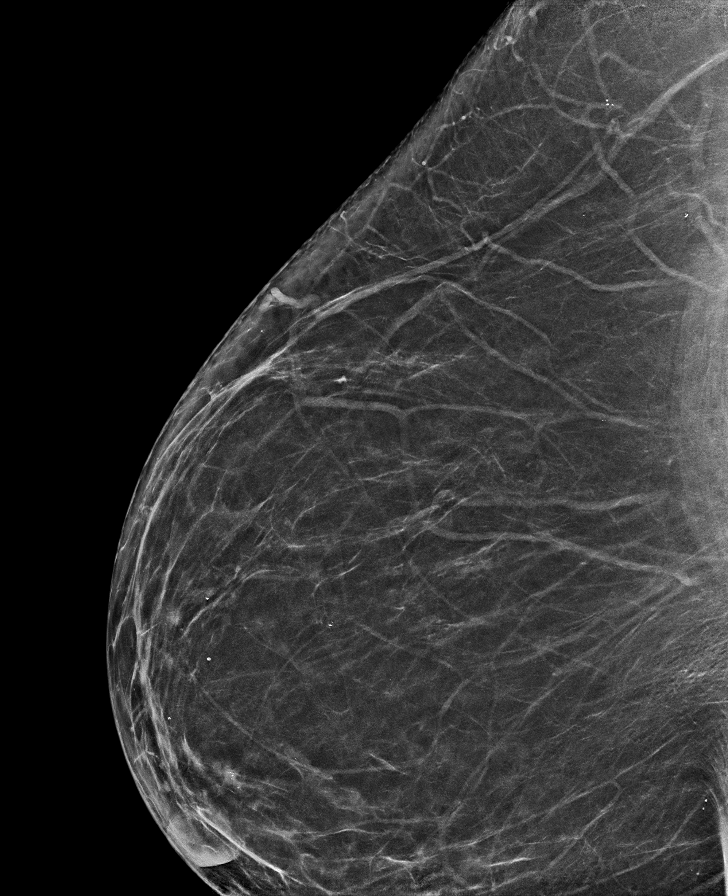

[R MLO synth-2D (2 of 2)]
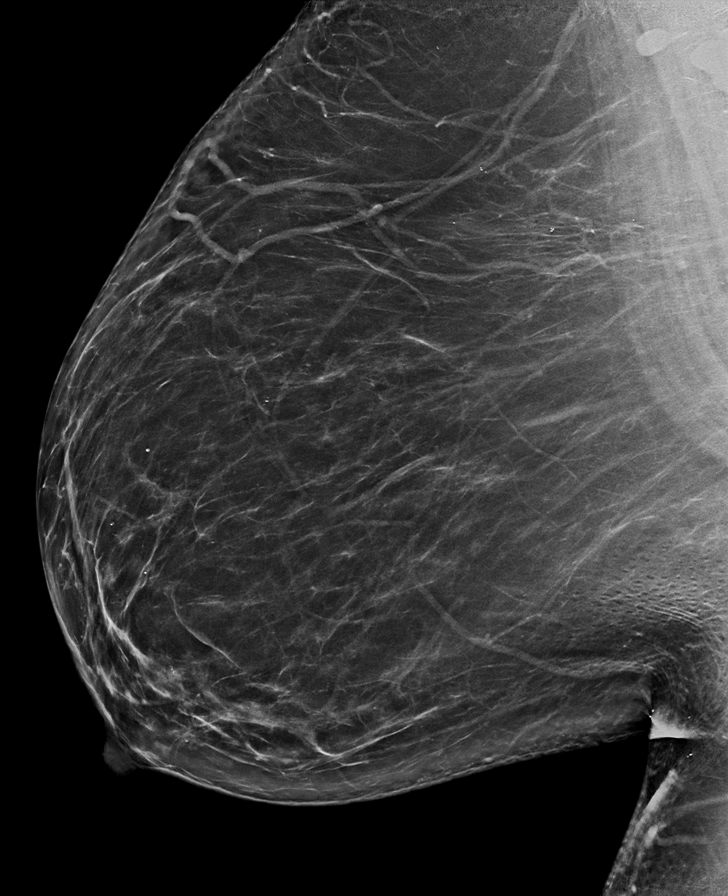

[L MLO synth-2D (2 of 2)]
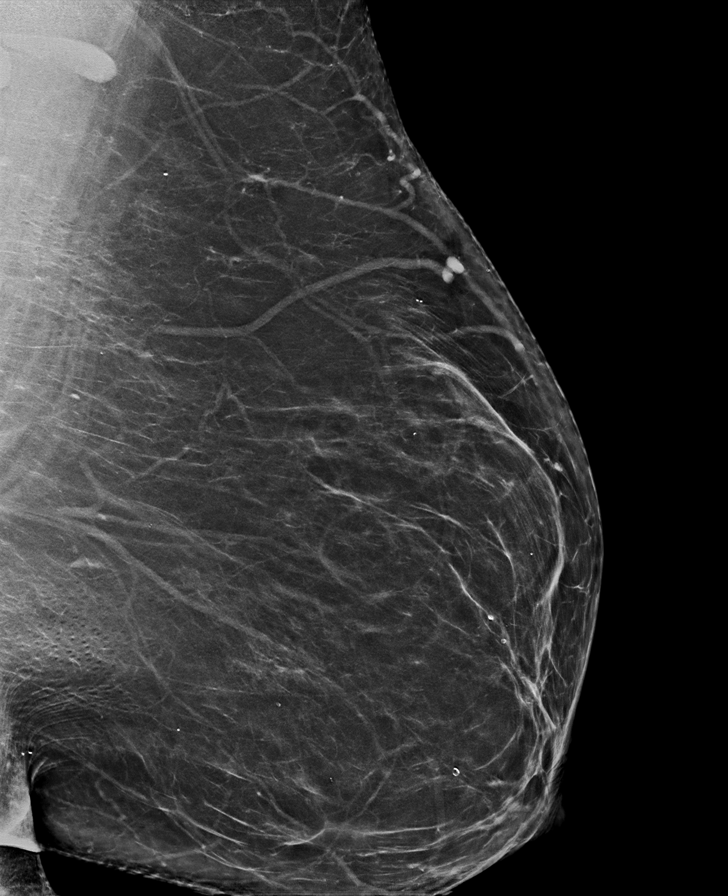

[L CC synth-2D]
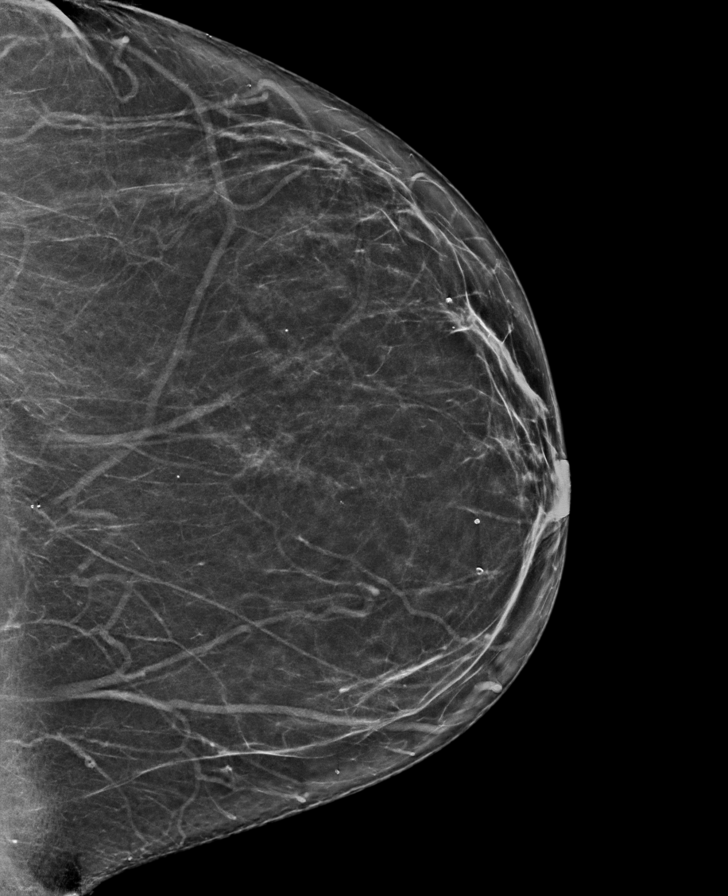

[L MLO tomo · tomo slice 59/86.0]
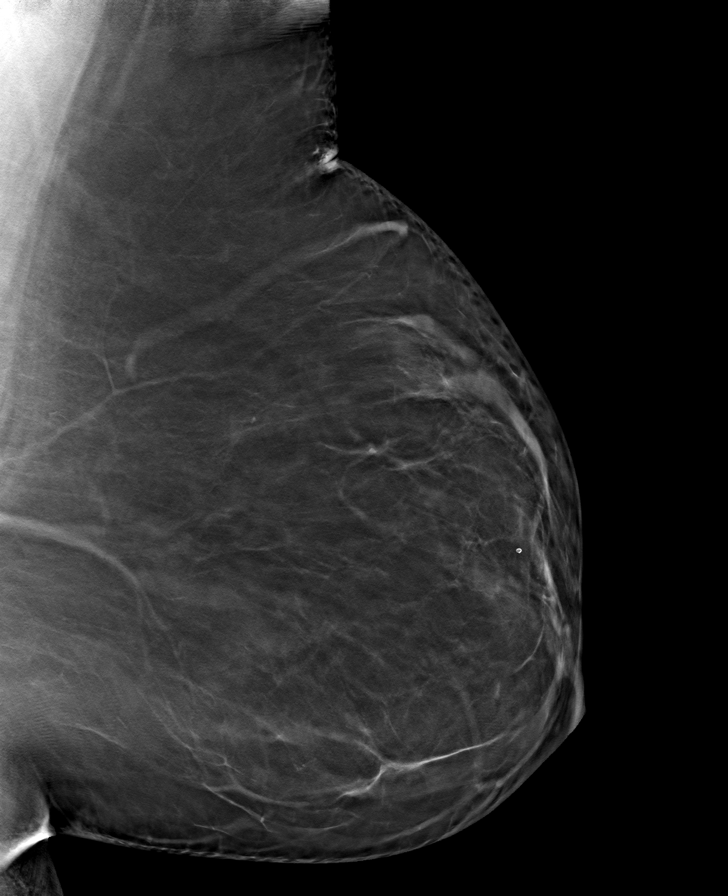

[8 of 40 positions shown; findings below may reference images not displayed]

FINDINGS: There are no findings suspicious for malignancy.
IMPRESSION: No mammographic evidence of malignancy. A result letter of this
screening mammogram will be mailed directly to the patient.

RECOMMENDATION:
Screening mammogram in one year. (Code:0E-3-N98)

BI-RADS CATEGORY  1: Negative.

## 2023-07-27 ENCOUNTER — Other Ambulatory Visit: Payer: Self-pay | Admitting: Physician Assistant

## 2023-07-27 DIAGNOSIS — Z1231 Encounter for screening mammogram for malignant neoplasm of breast: Secondary | ICD-10-CM

## 2023-08-06 ENCOUNTER — Ambulatory Visit
Admission: RE | Admit: 2023-08-06 | Discharge: 2023-08-06 | Disposition: A | Discharge status: Home / Self Care | Payer: Medicaid Other | Source: Ambulatory Visit | Attending: Physician Assistant | Admitting: Physician Assistant

## 2023-08-06 DIAGNOSIS — Z1231 Encounter for screening mammogram for malignant neoplasm of breast: Secondary | ICD-10-CM | POA: Diagnosis present

## 2023-10-05 ENCOUNTER — Encounter: Payer: Self-pay | Admitting: *Deleted

## 2024-06-06 ENCOUNTER — Other Ambulatory Visit: Payer: Self-pay | Admitting: Physician Assistant

## 2024-06-06 DIAGNOSIS — Z1231 Encounter for screening mammogram for malignant neoplasm of breast: Secondary | ICD-10-CM

## 2024-09-01 ENCOUNTER — Encounter
